# Patient Record
Sex: Female | Born: 1975 | Race: White | Hispanic: No | Smoking: Current every day smoker
Health system: Southern US, Community
[De-identification: ages and names within clinical notes are randomized; demographics above are authoritative.]

## PROBLEM LIST (undated history)

## (undated) DIAGNOSIS — M549 Dorsalgia, unspecified: Secondary | ICD-10-CM

## (undated) DIAGNOSIS — G8929 Other chronic pain: Secondary | ICD-10-CM

## (undated) DIAGNOSIS — F419 Anxiety disorder, unspecified: Secondary | ICD-10-CM

## (undated) DIAGNOSIS — F329 Major depressive disorder, single episode, unspecified: Secondary | ICD-10-CM

## (undated) DIAGNOSIS — F32A Depression, unspecified: Secondary | ICD-10-CM

## (undated) HISTORY — PX: BACK SURGERY: SHX140

## (undated) HISTORY — PX: BONY PELVIS SURGERY: SHX572

---

## 2004-09-09 ENCOUNTER — Emergency Department (HOSPITAL_COMMUNITY): Admission: EM | Admit: 2004-09-09 | Discharge: 2004-09-09 | Payer: Self-pay | Admitting: Emergency Medicine

## 2010-09-28 DIAGNOSIS — R072 Precordial pain: Secondary | ICD-10-CM

## 2010-10-31 ENCOUNTER — Emergency Department (HOSPITAL_COMMUNITY)
Admission: EM | Admit: 2010-10-31 | Discharge: 2010-10-31 | Disposition: A | Discharge status: Home / Self Care | Payer: Self-pay | Attending: Emergency Medicine | Admitting: Emergency Medicine

## 2010-10-31 ENCOUNTER — Encounter: Payer: Self-pay | Admitting: *Deleted

## 2010-10-31 DIAGNOSIS — M62838 Other muscle spasm: Secondary | ICD-10-CM | POA: Insufficient documentation

## 2010-10-31 DIAGNOSIS — IMO0001 Reserved for inherently not codable concepts without codable children: Secondary | ICD-10-CM | POA: Insufficient documentation

## 2010-10-31 DIAGNOSIS — F17200 Nicotine dependence, unspecified, uncomplicated: Secondary | ICD-10-CM | POA: Insufficient documentation

## 2010-10-31 DIAGNOSIS — F411 Generalized anxiety disorder: Secondary | ICD-10-CM | POA: Insufficient documentation

## 2010-10-31 DIAGNOSIS — G8929 Other chronic pain: Secondary | ICD-10-CM

## 2010-10-31 HISTORY — DX: Anxiety disorder, unspecified: F41.9

## 2010-10-31 HISTORY — DX: Major depressive disorder, single episode, unspecified: F32.9

## 2010-10-31 HISTORY — DX: Dorsalgia, unspecified: M54.9

## 2010-10-31 HISTORY — DX: Depression, unspecified: F32.A

## 2010-10-31 HISTORY — DX: Other chronic pain: G89.29

## 2010-10-31 MED ORDER — IBUPROFEN 800 MG PO TABS
800.0000 mg | ORAL_TABLET | Freq: Once | ORAL | Status: AC
  Administered 2010-10-31: 800 mg via ORAL
  Filled 2010-10-31: qty 1

## 2010-10-31 MED ORDER — DIAZEPAM 5 MG PO TABS: 5.0000 mg | ORAL_TABLET | Freq: Two times a day (BID) | ORAL | Status: AC

## 2010-10-31 MED ORDER — DIAZEPAM 5 MG PO TABS
5.0000 mg | ORAL_TABLET | Freq: Once | ORAL | Status: AC
  Administered 2010-10-31: 5 mg via ORAL
  Filled 2010-10-31: qty 1

## 2010-10-31 MED ORDER — IBUPROFEN 600 MG PO TABS: 600.0000 mg | ORAL_TABLET | Freq: Four times a day (QID) | ORAL | Status: AC | PRN

## 2010-10-31 NOTE — ED Notes (Signed)
C/o generalized chronic back pain "for a while."  Denies new injury.

## 2010-10-31 NOTE — ED Notes (Signed)
Pa in with pt.

## 2010-10-31 NOTE — ED Provider Notes (Signed)
Medical screening examination/treatment/procedure(s) were performed by non-physician practitioner and as supervising physician I was immediately available for consultation/collaboration. Devoria Albe, MD, Armando Gang  Ward Givens, MD 10/31/10 [phone]
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
## 2010-11-14 ENCOUNTER — Emergency Department (HOSPITAL_COMMUNITY)
Admission: EM | Admit: 2010-11-14 | Discharge: 2010-11-14 | Disposition: A | Discharge status: Home / Self Care | Payer: Self-pay | Attending: Emergency Medicine | Admitting: Emergency Medicine

## 2010-11-14 ENCOUNTER — Encounter (HOSPITAL_COMMUNITY): Payer: Self-pay | Admitting: *Deleted

## 2010-11-14 ENCOUNTER — Emergency Department (HOSPITAL_COMMUNITY): Payer: Self-pay

## 2010-11-14 DIAGNOSIS — F17200 Nicotine dependence, unspecified, uncomplicated: Secondary | ICD-10-CM | POA: Insufficient documentation

## 2010-11-14 DIAGNOSIS — R51 Headache: Secondary | ICD-10-CM | POA: Insufficient documentation

## 2010-11-14 DIAGNOSIS — M545 Low back pain, unspecified: Secondary | ICD-10-CM | POA: Insufficient documentation

## 2010-11-14 DIAGNOSIS — F329 Major depressive disorder, single episode, unspecified: Secondary | ICD-10-CM | POA: Insufficient documentation

## 2010-11-14 DIAGNOSIS — F3289 Other specified depressive episodes: Secondary | ICD-10-CM | POA: Insufficient documentation

## 2010-11-14 DIAGNOSIS — Z888 Allergy status to other drugs, medicaments and biological substances status: Secondary | ICD-10-CM | POA: Insufficient documentation

## 2010-11-14 DIAGNOSIS — N949 Unspecified condition associated with female genital organs and menstrual cycle: Secondary | ICD-10-CM | POA: Insufficient documentation

## 2010-11-14 DIAGNOSIS — M549 Dorsalgia, unspecified: Secondary | ICD-10-CM

## 2010-11-14 DIAGNOSIS — F411 Generalized anxiety disorder: Secondary | ICD-10-CM | POA: Insufficient documentation

## 2010-11-14 LAB — URINALYSIS, ROUTINE W REFLEX MICROSCOPIC
Hgb urine dipstick: NEGATIVE
Leukocytes, UA: NEGATIVE
Protein, ur: NEGATIVE mg/dL
Urobilinogen, UA: 0.2 mg/dL (ref 0.0–1.0)

## 2010-11-14 LAB — PREGNANCY, URINE: Preg Test, Ur: NEGATIVE

## 2010-11-14 LAB — COMPREHENSIVE METABOLIC PANEL
BUN: 13 mg/dL (ref 6–23)
CO2: 27 mEq/L (ref 19–32)
Calcium: 9.7 mg/dL (ref 8.4–10.5)
Creatinine, Ser: 0.59 mg/dL (ref 0.50–1.10)
GFR calc Af Amer: >60 mL/min (ref >60)
GFR calc non Af Amer: >60 mL/min (ref >60)
Glucose, Bld: 70 mg/dL (ref 70–99)

## 2010-11-14 LAB — CBC
HCT: 43.5 % (ref 36.0–46.0)
MCH: 31 pg (ref 26.0–34.0)
MCV: 91.8 fL (ref 78.0–100.0)
RBC: 4.74 MIL/uL (ref 3.87–5.11)
RDW: 12.8 % (ref 11.5–15.5)
WBC: 10.8 10*3/uL — ABNORMAL HIGH (ref 4.0–10.5)

## 2010-11-14 LAB — DIFFERENTIAL
Eosinophils Relative: 1 % (ref 0–5)
Lymphocytes Relative: 30 % (ref 12–46)
Lymphs Abs: 3.3 10*3/uL (ref 0.7–4.0)
Monocytes Absolute: 0.7 10*3/uL (ref 0.1–1.0)

## 2010-11-14 MED ORDER — LORAZEPAM 1 MG PO TABS
1.0000 mg | ORAL_TABLET | Freq: Once | ORAL | Status: AC
  Administered 2010-11-14: 1 mg via ORAL
  Filled 2010-11-14: qty 1

## 2010-11-14 MED ORDER — SODIUM CHLORIDE 0.9 % IV SOLN
Freq: Once | INTRAVENOUS | Status: AC
  Administered 2010-11-14: 125 mL via INTRAVENOUS

## 2010-11-14 MED ORDER — OXYCODONE-ACETAMINOPHEN 5-325 MG PO TABS: 1.0000 | ORAL_TABLET | Freq: Four times a day (QID) | ORAL | Status: AC | PRN

## 2010-11-14 MED ORDER — ONDANSETRON HCL 4 MG/2ML IJ SOLN
4.0000 mg | Freq: Once | INTRAMUSCULAR | Status: AC
  Administered 2010-11-14: 4 mg via INTRAVENOUS
  Filled 2010-11-14: qty 2

## 2010-11-14 MED ORDER — DIPHENHYDRAMINE HCL 50 MG/ML IJ SOLN
25.0000 mg | Freq: Once | INTRAMUSCULAR | Status: AC
  Administered 2010-11-14: 50 mg via INTRAVENOUS
  Filled 2010-11-14: qty 1

## 2010-11-14 MED ORDER — HYDROMORPHONE HCL 1 MG/ML IJ SOLN
1.0000 mg | Freq: Once | INTRAMUSCULAR | Status: AC
  Administered 2010-11-14: 1 mg via INTRAVENOUS
  Filled 2010-11-14: qty 1

## 2010-11-14 NOTE — ED Notes (Signed)
Pt c/o chronic pain in her back, right ankle, and headache for a couple days. States that she is having panic attacks and feels like she can't breathe. Pt states that she cries all the time.

## 2010-11-14 NOTE — ED Notes (Signed)
Pt to xray dept.

## 2010-11-14 NOTE — ED Notes (Signed)
Pt states lower chronic back pain and ankle pain from previous fall in 2011. NAD at this time.
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
## 2010-11-14 NOTE — ED Notes (Signed)
Pt requesting "something for anxiety" EMD aware. EMD going in to see pt.

## 2010-11-16 ENCOUNTER — Emergency Department (HOSPITAL_COMMUNITY)
Admission: EM | Admit: 2010-11-16 | Discharge: 2010-11-16 | Disposition: A | Discharge status: Home / Self Care | Payer: Self-pay | Attending: Emergency Medicine | Admitting: Emergency Medicine

## 2010-11-16 ENCOUNTER — Encounter (HOSPITAL_COMMUNITY): Payer: Self-pay | Admitting: Emergency Medicine

## 2010-11-16 DIAGNOSIS — M549 Dorsalgia, unspecified: Secondary | ICD-10-CM | POA: Insufficient documentation

## 2010-11-16 DIAGNOSIS — F329 Major depressive disorder, single episode, unspecified: Secondary | ICD-10-CM | POA: Insufficient documentation

## 2010-11-16 DIAGNOSIS — M5380 Other specified dorsopathies, site unspecified: Secondary | ICD-10-CM | POA: Insufficient documentation

## 2010-11-16 DIAGNOSIS — IMO0002 Reserved for concepts with insufficient information to code with codable children: Secondary | ICD-10-CM

## 2010-11-16 DIAGNOSIS — Z9181 History of falling: Secondary | ICD-10-CM | POA: Insufficient documentation

## 2010-11-16 DIAGNOSIS — G8921 Chronic pain due to trauma: Secondary | ICD-10-CM | POA: Insufficient documentation

## 2010-11-16 DIAGNOSIS — F3289 Other specified depressive episodes: Secondary | ICD-10-CM | POA: Insufficient documentation

## 2010-11-16 DIAGNOSIS — F17200 Nicotine dependence, unspecified, uncomplicated: Secondary | ICD-10-CM | POA: Insufficient documentation

## 2010-11-16 DIAGNOSIS — F411 Generalized anxiety disorder: Secondary | ICD-10-CM | POA: Insufficient documentation

## 2010-11-16 DIAGNOSIS — Z113 Encounter for screening for infections with a predominantly sexual mode of transmission: Secondary | ICD-10-CM | POA: Insufficient documentation

## 2010-11-16 LAB — URINALYSIS, ROUTINE W REFLEX MICROSCOPIC
Bilirubin Urine: NEGATIVE
Nitrite: NEGATIVE
Specific Gravity, Urine: 1.015 (ref 1.005–1.030)
pH: 6.5 (ref 5.0–8.0)

## 2010-11-16 LAB — URINE MICROSCOPIC-ADD ON

## 2010-11-16 LAB — RAPID HIV SCREEN (WH-MAU): Rapid HIV Screen: NONREACTIVE

## 2010-11-16 MED ORDER — HYDROCODONE-ACETAMINOPHEN 5-500 MG PO TABS: 1.0000 | ORAL_TABLET | Freq: Four times a day (QID) | ORAL | Status: AC | PRN

## 2010-11-16 MED ORDER — CYCLOBENZAPRINE HCL 10 MG PO TABS
ORAL_TABLET | ORAL | Status: AC
  Filled 2010-11-16: qty 1

## 2010-11-16 MED ORDER — HYDROCODONE-ACETAMINOPHEN 5-325 MG PO TABS
1.0000 | ORAL_TABLET | Freq: Once | ORAL | Status: AC
  Administered 2010-11-16: 1 via ORAL

## 2010-11-16 MED ORDER — HYDROCODONE-ACETAMINOPHEN 5-325 MG PO TABS
ORAL_TABLET | ORAL | Status: AC
  Filled 2010-11-16: qty 1

## 2010-11-16 MED ORDER — CYCLOBENZAPRINE HCL 10 MG PO TABS
10.0000 mg | ORAL_TABLET | Freq: Once | ORAL | Status: AC
Start: 2010-11-16 — End: 2010-11-16
  Administered 2010-11-16: 10 mg via ORAL

## 2010-11-16 MED ORDER — CYCLOBENZAPRINE HCL 5 MG PO TABS: 5.0000 mg | ORAL_TABLET | Freq: Three times a day (TID) | ORAL | Status: AC | PRN

## 2010-11-16 NOTE — ED Notes (Signed)
Called at 13:10 with no answer x 1

## 2010-11-16 NOTE — ED Notes (Signed)
Pt c/o chronic pain and anxiety. Pt seen 9/24 in ed for same. Pt given prescription for ativan and percocet. Pt states neither are helping her. Pt states she wants a prescription for valium. Nad noted.
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
## 2010-11-16 NOTE — ED Provider Notes (Signed)
History   Chart scribed for Ethelda Chick, MD by Enos Fling; the patient was seen in room APA04/APA04; this patient's care was started at 11:14 AM.    CSN: [id number] Arrival date & time: 11/16/2010 10:31 AM  Chief Complaint  Patient presents with  . Back Pain  . Anxiety    HPI Melody Carter is a 35 y.o. female who presents to the Emergency Department complaining of back pain. Pt c/o worsening back pain since seen in ED 2 days ago; she was prescribed percocet but states it is causing pruritis and only providing minimal relief of pain. Pt has tried benadryl with no relief of pruritis. Also given ativan which she states is not helping her anxiety, pt requesting valium prescription which she states helped her anxiety previously. Pt reports h/o chronic back pain since injury from fall last year, usually takes ibuprofen and aspirin for pain. Pt denies new injury or fall. No numbness, tingling, weakness, or incontinence. No other complaints.  No PCP  Past Medical History  Diagnosis Date  . Chronic back pain   . Anxiety   . Depression     Past Surgical History  Procedure Date  . Bony pelvis surgery     History reviewed. No pertinent family history.  History  Substance Use Topics  . Smoking status: Current Everyday Smoker    Types: Cigarettes  . Smokeless tobacco: Not on file  . Alcohol Use: No    OB History    Grav Para Term Preterm Abortions TAB SAB Ect Mult Living                  Review of Systems 10 Systems reviewed and are negative for acute change except as noted in the HPI.  Allergies  Geodon; Haldol; Risperidone and related; and Percocet  Home Medications   Current Outpatient Rx  Name Route Sig Dispense Refill  . ACETAMINOPHEN 500 MG PO TABS Oral Take 1,000 mg by mouth every 6 (six) hours as needed. Pain     . DIVALPROEX SODIUM 500 MG PO TBEC Oral Take 500 mg by mouth 2 (two) times daily.      . IBUPROFEN 200 MG PO TABS Oral Take 400 mg by mouth  every 6 (six) hours as needed. Pain    . OXYCODONE-ACETAMINOPHEN 5-325 MG PO TABS Oral Take 1 tablet by mouth every 6 (six) hours as needed for pain. 30 tablet 0  . DIVALPROEX SODIUM 500 MG PO TB24 Oral Take 500 mg by mouth 2 (two) times daily.     . DULOXETINE HCL 30 MG PO CPEP Oral Take 30 mg by mouth daily.      Marland Kitchen HYDROCODONE-ACETAMINOPHEN 5-500 MG PO TABS Oral Take 1-2 tablets by mouth every 6 (six) hours as needed for pain. 15 tablet 0    BP 138/96  Pulse 113  Temp 98.6 F (37 C)  Resp 20  Ht 5\' 3"  (1.6 m)  Wt 151 lb (68.493 kg)  BMI 26.75 kg/m2  SpO2 96%  LMP 10/09/2010  Physical Exam  Nursing note and vitals reviewed. Constitutional: She is oriented to person, place, and time. She appears well-developed and well-nourished. No distress.  HENT:  Head: Normocephalic.  Mouth/Throat: Mucous membranes are normal.  Eyes: Conjunctivae are normal.  Neck: Normal range of motion. Neck supple.  Cardiovascular: Normal rate, regular rhythm and intact distal pulses.  Exam reveals no gallop and no friction rub.   No murmur heard. Pulmonary/Chest: Effort normal and breath sounds normal.  She has no wheezes. She has no rales.  Abdominal: Soft. There is no tenderness.  Musculoskeletal: Normal range of motion. She exhibits no edema.       Spine nontender; palpable muscle spasms to back  Neurological: She is alert and oriented to person, place, and time.  Skin: Skin is warm and dry. No rash noted.  Psychiatric: She has a normal mood and affect.    ED Course  Procedures - none  OTHER DATA REVIEWED: Nursing notes and vital signs reviewed. Prior records reviewed.   LABS / RADIOLOGY:   All results reviewed and discussed, questions answered, pt agreeable with plan.     MDM  Prior ED visits reviewed.  xrays from 9/24 reviewed as well as urine.  No acute abnormalities there.  Pt requesing valium as she states ativan is not helping her anxiety.  Also requesting dilaudid for pain  control- states percocet makes her too itchy.  Offered benadryl, flexeril- pt states these will not help.  Pt agreeable with trying hydrococone as I stated I would not prescribe dilaudid for her chronic pain.  Long d/w patient about the need for a PMD or pain management to aid with her chronic pain.    IMPRESSION: 1. Chronic pain due to injury       MEDS GIVEN IN ED:  Medications  HYDROcodone-acetaminophen (VICODIN) 5-500 MG per tablet (not administered)     DISCHARGE MEDICATIONS: New Prescriptions   HYDROCODONE-ACETAMINOPHEN (VICODIN) 5-500 MG PER TABLET    Take 1-2 tablets by mouth every 6 (six) hours as needed for pain.     SCRIBE ATTESTATION: I personally performed the services described in this documentation, which was scribed in my presence. The recorded information has been reviewed and considered. Ethelda Chick, MD         Ethelda Chick, MD 11/16/10 [phone]

## 2010-11-16 NOTE — ED Notes (Signed)
Hx of chronic back pain with multiple surgeries; reports fall in December of 2011 and has had worsening pain since; was seen for same at this ED 2 days ago; states Percocet makes her itch, even with taking Benadryl, and that Tylenol and ibuprofen do not help; pt states, "I don't have any medicaid so I can't see a doctor".  Pt appears drowsy, in no distress; reports 10/10 lower back pain.

## 2010-11-17 LAB — GC/CHLAMYDIA PROBE AMP, GENITAL
Chlamydia, DNA Probe: NEGATIVE
GC Probe Amp, Genital: NEGATIVE

## 2010-11-17 LAB — HEPATITIS PANEL, ACUTE: Hep B C IgM: NEGATIVE

## 2011-01-13 ENCOUNTER — Emergency Department (HOSPITAL_COMMUNITY): Payer: Medicaid Other

## 2011-01-13 ENCOUNTER — Emergency Department (HOSPITAL_COMMUNITY)
Admission: EM | Admit: 2011-01-13 | Discharge: 2011-01-13 | Disposition: A | Discharge status: Home / Self Care | Payer: Medicaid Other | Attending: Emergency Medicine | Admitting: Emergency Medicine

## 2011-01-13 ENCOUNTER — Encounter (HOSPITAL_COMMUNITY): Payer: Self-pay | Admitting: *Deleted

## 2011-01-13 DIAGNOSIS — M25579 Pain in unspecified ankle and joints of unspecified foot: Secondary | ICD-10-CM | POA: Insufficient documentation

## 2011-01-13 DIAGNOSIS — F411 Generalized anxiety disorder: Secondary | ICD-10-CM | POA: Insufficient documentation

## 2011-01-13 DIAGNOSIS — F419 Anxiety disorder, unspecified: Secondary | ICD-10-CM

## 2011-01-13 DIAGNOSIS — M545 Low back pain, unspecified: Secondary | ICD-10-CM | POA: Insufficient documentation

## 2011-01-13 DIAGNOSIS — G8929 Other chronic pain: Secondary | ICD-10-CM

## 2011-01-13 DIAGNOSIS — X500XXA Overexertion from strenuous movement or load, initial encounter: Secondary | ICD-10-CM | POA: Insufficient documentation

## 2011-01-13 DIAGNOSIS — S93409A Sprain of unspecified ligament of unspecified ankle, initial encounter: Secondary | ICD-10-CM | POA: Insufficient documentation

## 2011-01-13 DIAGNOSIS — S96919A Strain of unspecified muscle and tendon at ankle and foot level, unspecified foot, initial encounter: Secondary | ICD-10-CM

## 2011-01-13 LAB — URINALYSIS, ROUTINE W REFLEX MICROSCOPIC
Glucose, UA: NEGATIVE mg/dL
Ketones, ur: NEGATIVE mg/dL
Leukocytes, UA: NEGATIVE
Specific Gravity, Urine: 1.025 (ref 1.005–1.030)
pH: 6 (ref 5.0–8.0)

## 2011-01-13 LAB — URINE MICROSCOPIC-ADD ON

## 2011-01-13 LAB — POCT PREGNANCY, URINE: Preg Test, Ur: NEGATIVE

## 2011-01-13 MED ORDER — DIAZEPAM 5 MG PO TABS: 5.0000 mg | ORAL_TABLET | Freq: Two times a day (BID) | ORAL | Status: AC | PRN

## 2011-01-13 MED ORDER — IBUPROFEN 800 MG PO TABS: 800.0000 mg | ORAL_TABLET | Freq: Three times a day (TID) | ORAL | Status: AC | PRN

## 2011-01-13 MED ORDER — HYDROCODONE-ACETAMINOPHEN 5-325 MG PO TABS: 1.0000 | ORAL_TABLET | ORAL | Status: AC | PRN

## 2011-01-13 MED ORDER — DIAZEPAM 5 MG PO TABS
5.0000 mg | ORAL_TABLET | Freq: Once | ORAL | Status: AC
  Administered 2011-01-13: 5 mg via ORAL
  Filled 2011-01-13: qty 1

## 2011-01-13 MED ORDER — IBUPROFEN 800 MG PO TABS
800.0000 mg | ORAL_TABLET | Freq: Once | ORAL | Status: AC
  Administered 2011-01-13: 800 mg via ORAL
  Filled 2011-01-13: qty 1

## 2011-01-13 NOTE — ED Provider Notes (Signed)
History     CSN: [id number] Arrival date & time: 01/13/2011  9:01 AM   First MD Initiated Contact with Patient 01/13/11 0900      Chief Complaint  Patient presents with  . Joint Pain    (Consider location/radiation/quality/duration/timing/severity/associated sxs/prior treatment) Patient is a 35 y.o. female presenting with back pain. The history is provided by the patient.  Back Pain  This is a chronic problem. The problem occurs constantly. The problem has been gradually worsening. Associated with: She fell off a bridge one year ago,  requiring back surgery and placment of rods.  She has constant pain which has worsened over the past 4 months.   The pain is present in the thoracic spine and lumbar spine. The pain does not radiate. The pain is at a severity of 10/10. The pain is severe. The symptoms are aggravated by bending and twisting. Associated symptoms include dysuria. Pertinent negatives include no chest pain, no fever, no numbness, no headaches, no abdominal pain, no bladder incontinence, no paresthesias, no paresis, no tingling and no weakness. Associated symptoms comments: She also reports tripping while on the bottom step,  Causing twisting injury to her right ankle.  She also describes increased urinary frequency and burning pain with urination only,  Along with low back cramping and pressure.  She has just started her menses,  Having low pelvic cramping as well.  Denies vaginal discharge,  Fevers,  Chills, nausea or vomiting. She has increased anxiety as well,  Is scheduled to establish care with a psychiatrist in [place] in 3 days.  Denies suicidal,  Homicidal ideation..    Past Medical History  Diagnosis Date  . Chronic back pain   . Anxiety   . Depression     Past Surgical History  Procedure Date  . Bony pelvis surgery     No family history on file.  History  Substance Use Topics  . Smoking status: Current Everyday Smoker    Types: Cigarettes  . Smokeless  tobacco: Not on file  . Alcohol Use: No    OB History    Grav Para Term Preterm Abortions TAB SAB Ect Mult Living                  Review of Systems  Constitutional: Negative for fever.  HENT: Negative for congestion, sore throat and neck pain.   Eyes: Negative.   Respiratory: Negative for chest tightness and shortness of breath.   Cardiovascular: Negative for chest pain.  Gastrointestinal: Negative for nausea and abdominal pain.  Genitourinary: Positive for dysuria. Negative for bladder incontinence.  Musculoskeletal: Positive for back pain and arthralgias. Negative for joint swelling.  Skin: Negative.  Negative for rash and wound.  Neurological: Negative for dizziness, tingling, weakness, light-headedness, numbness, headaches and paresthesias.  Hematological: Negative.   Psychiatric/Behavioral: Negative for agitation. The patient is nervous/anxious.     Allergies  Geodon; Haldol; Risperidone and related; and Percocet  Home Medications   Current Outpatient Rx  Name Route Sig Dispense Refill  . RISPERDAL PO Oral Take by mouth.      . COGNEX PO Oral Take by mouth.      Marland Kitchen DIVALPROEX SODIUM 500 MG PO TB24 Oral Take 500 mg by mouth 2 (two) times daily.     Marland Kitchen DIVALPROEX SODIUM 500 MG PO TBEC Oral Take 500 mg by mouth 2 (two) times daily.      . IBUPROFEN 200 MG PO TABS Oral Take 400 mg by mouth every 6 (six)  hours as needed. Pain      BP 137/95  Pulse 87  Temp(Src) 98 F (36.7 C) (Oral)  Resp 20  Ht 5\' 3"  (1.6 m)  Wt 148 lb (67.132 kg)  BMI 26.22 kg/m2  SpO2 100%  LMP 01/13/2011  Physical Exam  Nursing note and vitals reviewed. Constitutional: She is oriented to person, place, and time. She appears well-developed and well-nourished.  HENT:  Head: Normocephalic and atraumatic.  Eyes: Conjunctivae are normal.  Neck: Normal range of motion.  Cardiovascular: Normal rate, regular rhythm, normal heart sounds and intact distal pulses.   Pulmonary/Chest: Effort normal and  breath sounds normal. She has no wheezes.  Abdominal: Soft. Bowel sounds are normal. There is no tenderness.  Musculoskeletal: Normal range of motion.       Right ankle: She exhibits normal range of motion, no swelling and no ecchymosis. tenderness. Lateral malleolus tenderness found. Achilles tendon normal.       Generalized back ttp,  Including entire spine,  Plus paraspinal soft tissues.  No visible trauma,  Ecchymosis,  Edema,  Well healed midline incision lumbar and thoracic spine.  Patient freely sits up in bed and bends forward to point to area of ankle pain and trauma with no apparent distress.  Neurological: She is alert and oriented to person, place, and time.  Skin: Skin is warm and dry.  Psychiatric: Her speech is normal and behavior is normal. Thought content normal. Her mood appears anxious. Her affect is not blunt and not inappropriate.    ED Course  Procedures (including critical care time)   Labs Reviewed  URINALYSIS, ROUTINE W REFLEX MICROSCOPIC  PREGNANCY, URINE   No results found.   No diagnosis found.    MDM  Ankle xray negative for acute injury,  Ace applied.  Instructed in RICE tx.  Chronic low back pain with no neuro deficits on today exam or by history.  Anxiety without suicidal/homical ideation - is scheduled to establish care for mental health issues in 3 days.  Valium for anxiety and/or chronic low back pain and muscle spasm.    The patient appears reasonably screened and/or stabilized for discharge and I doubt any other medical condition or other [facility] requiring further screening, evaluation, or treatment in the ED at this time prior to discharge.      Candis Musa, PA 01/13/11 1006

## 2011-01-13 NOTE — ED Notes (Signed)
Pt reports she injured her back in 2011, reports increased pain to lower back and rt ankle pain worsening over the past 2 days

## 2011-01-13 NOTE — ED Provider Notes (Signed)
Medical screening examination/treatment/procedure(s) were performed by non-physician practitioner and as supervising physician I was immediately available for consultation/collaboration. Devoria Albe, MD, Armando Gang   Ward Givens, MD 01/13/11 [phone]

## 2011-02-01 NOTE — ED Provider Notes (Signed)
Patient called back today upset complaining to charge nurse. States Dr. Gerilyn Pilgrim will not see her because she does not live in [place] anymore. She states she wants a referral to Dr. Eduard Clos- Freida Busman. Discharge instructions reprinted  adding Dr. Eduard Clos [place]'s phone number.  Devoria Albe, MD, Armando Gang   Ward Givens, MD 02/01/11 [phone]

## 2012-02-11 ENCOUNTER — Emergency Department (HOSPITAL_COMMUNITY)
Admission: EM | Admit: 2012-02-11 | Discharge: 2012-02-11 | Discharge status: Against Medical Advice | Payer: Self-pay | Attending: Emergency Medicine | Admitting: Emergency Medicine

## 2012-02-11 ENCOUNTER — Encounter (HOSPITAL_COMMUNITY): Payer: Self-pay | Admitting: Emergency Medicine

## 2012-02-11 DIAGNOSIS — F3289 Other specified depressive episodes: Secondary | ICD-10-CM | POA: Insufficient documentation

## 2012-02-11 DIAGNOSIS — G8929 Other chronic pain: Secondary | ICD-10-CM | POA: Insufficient documentation

## 2012-02-11 DIAGNOSIS — M549 Dorsalgia, unspecified: Secondary | ICD-10-CM | POA: Insufficient documentation

## 2012-02-11 DIAGNOSIS — K0889 Other specified disorders of teeth and supporting structures: Secondary | ICD-10-CM

## 2012-02-11 DIAGNOSIS — F411 Generalized anxiety disorder: Secondary | ICD-10-CM | POA: Insufficient documentation

## 2012-02-11 DIAGNOSIS — F329 Major depressive disorder, single episode, unspecified: Secondary | ICD-10-CM | POA: Insufficient documentation

## 2012-02-11 DIAGNOSIS — Z79899 Other long term (current) drug therapy: Secondary | ICD-10-CM | POA: Insufficient documentation

## 2012-02-11 MED ORDER — HYDROCODONE-ACETAMINOPHEN 5-325 MG PO TABS
1.0000 | ORAL_TABLET | Freq: Once | ORAL | Status: AC
  Administered 2012-02-11: 1 via ORAL
  Filled 2012-02-11: qty 1

## 2012-02-11 NOTE — ED Notes (Signed)
Had 8 teeth pulled x 3 days ago. States did not get rx antibiotic or pain med. No obvious swelling noted. C/o all over back pain, chronic back pain but worse this past week. r ankle pain, hx of crush injury. Nad.
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
## 2012-02-11 NOTE — ED Notes (Signed)
Pt not in room and gown on bed
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
## 2012-02-12 NOTE — ED Provider Notes (Signed)
Medical screening examination/treatment/procedure(s) were performed by non-physician practitioner and as supervising physician I was immediately available for consultation/collaboration.   Carleene Cooper III, MD 02/12/12 1336

## 2013-11-02 ENCOUNTER — Encounter (HOSPITAL_COMMUNITY): Payer: Self-pay | Admitting: Emergency Medicine

## 2013-11-02 ENCOUNTER — Emergency Department (HOSPITAL_COMMUNITY): Payer: Medicaid - Out of State

## 2013-11-02 ENCOUNTER — Emergency Department (HOSPITAL_COMMUNITY)
Admission: EM | Admit: 2013-11-02 | Discharge: 2013-11-02 | Disposition: A | Discharge status: Home / Self Care | Payer: Medicaid - Out of State | Attending: Emergency Medicine | Admitting: Emergency Medicine

## 2013-11-02 DIAGNOSIS — F17200 Nicotine dependence, unspecified, uncomplicated: Secondary | ICD-10-CM | POA: Insufficient documentation

## 2013-11-02 DIAGNOSIS — F411 Generalized anxiety disorder: Secondary | ICD-10-CM | POA: Insufficient documentation

## 2013-11-02 DIAGNOSIS — Y92009 Unspecified place in unspecified non-institutional (private) residence as the place of occurrence of the external cause: Secondary | ICD-10-CM | POA: Diagnosis not present

## 2013-11-02 DIAGNOSIS — Z8781 Personal history of (healed) traumatic fracture: Secondary | ICD-10-CM | POA: Insufficient documentation

## 2013-11-02 DIAGNOSIS — R296 Repeated falls: Secondary | ICD-10-CM | POA: Insufficient documentation

## 2013-11-02 DIAGNOSIS — M546 Pain in thoracic spine: Secondary | ICD-10-CM

## 2013-11-02 DIAGNOSIS — F3289 Other specified depressive episodes: Secondary | ICD-10-CM | POA: Diagnosis not present

## 2013-11-02 DIAGNOSIS — S99919A Unspecified injury of unspecified ankle, initial encounter: Secondary | ICD-10-CM

## 2013-11-02 DIAGNOSIS — S99929A Unspecified injury of unspecified foot, initial encounter: Secondary | ICD-10-CM | POA: Diagnosis present

## 2013-11-02 DIAGNOSIS — S8990XA Unspecified injury of unspecified lower leg, initial encounter: Secondary | ICD-10-CM | POA: Diagnosis present

## 2013-11-02 DIAGNOSIS — Z792 Long term (current) use of antibiotics: Secondary | ICD-10-CM | POA: Insufficient documentation

## 2013-11-02 DIAGNOSIS — IMO0002 Reserved for concepts with insufficient information to code with codable children: Secondary | ICD-10-CM | POA: Insufficient documentation

## 2013-11-02 DIAGNOSIS — S93409A Sprain of unspecified ligament of unspecified ankle, initial encounter: Secondary | ICD-10-CM | POA: Diagnosis not present

## 2013-11-02 DIAGNOSIS — F329 Major depressive disorder, single episode, unspecified: Secondary | ICD-10-CM | POA: Insufficient documentation

## 2013-11-02 DIAGNOSIS — S0993XA Unspecified injury of face, initial encounter: Secondary | ICD-10-CM | POA: Insufficient documentation

## 2013-11-02 DIAGNOSIS — S93401A Sprain of unspecified ligament of right ankle, initial encounter: Secondary | ICD-10-CM

## 2013-11-02 DIAGNOSIS — S199XXA Unspecified injury of neck, initial encounter: Secondary | ICD-10-CM

## 2013-11-02 DIAGNOSIS — Y9389 Activity, other specified: Secondary | ICD-10-CM | POA: Diagnosis not present

## 2013-11-02 DIAGNOSIS — G8929 Other chronic pain: Secondary | ICD-10-CM | POA: Diagnosis not present

## 2013-11-02 DIAGNOSIS — Z79899 Other long term (current) drug therapy: Secondary | ICD-10-CM | POA: Diagnosis not present

## 2013-11-02 MED ORDER — CYCLOBENZAPRINE HCL 10 MG PO TABS
10.0000 mg | ORAL_TABLET | Freq: Once | ORAL | Status: AC
  Administered 2013-11-02: 10 mg via ORAL
  Filled 2013-11-02: qty 1

## 2013-11-02 MED ORDER — HYDROCODONE-ACETAMINOPHEN 5-325 MG PO TABS
1.0000 | ORAL_TABLET | Freq: Once | ORAL | Status: AC
  Administered 2013-11-02: 1 via ORAL
  Filled 2013-11-02: qty 1

## 2013-11-02 MED ORDER — HYDROCODONE-ACETAMINOPHEN 7.5-325 MG PO TABS: 1.0000 | ORAL_TABLET | Freq: Four times a day (QID) | ORAL | Status: DC | PRN

## 2013-11-02 MED ORDER — KETOROLAC TROMETHAMINE 60 MG/2ML IM SOLN
60.0000 mg | Freq: Once | INTRAMUSCULAR | Status: AC
  Administered 2013-11-02: 60 mg via INTRAMUSCULAR
  Filled 2013-11-02: qty 2

## 2013-11-02 MED ORDER — CYCLOBENZAPRINE HCL 10 MG PO TABS: 10.0000 mg | ORAL_TABLET | Freq: Three times a day (TID) | ORAL | Status: DC | PRN

## 2013-11-02 NOTE — ED Notes (Signed)
Tammy PA in prior to RN, see PA assessment for further,  

## 2013-11-02 NOTE — Discharge Instructions (Signed)
Ankle Sprain An ankle sprain is an injury to the strong, fibrous tissues (ligaments) that hold your ankle bones together.  HOME CARE   Put ice on your ankle for 1-2 days or as told by your doctor.  Put ice in a plastic bag.  Place a towel between your skin and the bag.  Leave the ice on for 15-20 minutes at a time, every 2 hours while you are awake.  Only take medicine as told by your doctor.  Raise (elevate) your injured ankle above the level of your heart as much as possible for 2-3 days.  Use crutches if your doctor tells you to. Slowly put your own weight on the affected ankle. Use the crutches until you can walk without pain.  If you have a plaster splint:  Do not rest it on anything harder than a pillow for 24 hours.  Do not put weight on it.  Do not get it wet.  Take it off to shower or bathe.  If given, use an elastic wrap or support stocking for support. Take the wrap off if your toes lose feeling (numb), tingle, or turn cold or blue.  If you have an air splint:  Add or let out air to make it comfortable.  Take it off at night and to shower and bathe.  Wiggle your toes and move your ankle up and down often while you are wearing it. GET HELP IF:  You have rapidly increasing bruising or puffiness (swelling).  Your toes feel very cold.  You lose feeling in your foot.  Your medicine does not help your pain. GET HELP RIGHT AWAY IF:   Your toes lose feeling (numb) or turn blue.  You have severe pain that is increasing. MAKE SURE YOU:   Understand these instructions.  Will watch your condition.  Will get help right away if you are not doing well or get worse. Document Released: 07/26/2007 Document Revised: 06/23/2013 Document Reviewed: 08/21/2011 [employer] Patient Information 2015 [place], [place]. This information is not intended to replace advice given to you by your health care provider. Make sure you discuss any questions you have with your health care  provider.  Back Pain, Adult Back pain is very common. The pain often gets better over time. The cause of back pain is usually not dangerous. Most people can learn to manage their back pain on their own.  HOME CARE   Stay active. Start with short walks on flat ground if you can. Try to walk farther each day.  Do not sit, drive, or stand in one place for more than 30 minutes. Do not stay in bed.  Do not avoid exercise or work. Activity can help your back heal faster.  Be careful when you bend or lift an object. Bend at your knees, keep the object close to you, and do not twist.  Sleep on a firm mattress. Lie on your side, and bend your knees. If you lie on your back, put a pillow under your knees.  Only take medicines as told by your doctor.  Put ice on the injured area.  Put ice in a plastic bag.  Place a towel between your skin and the bag.  Leave the ice on for 15-20 minutes, 03-04 times a day for the first 2 to 3 days. After that, you can switch between ice and heat packs.  Ask your doctor about back exercises or massage.  Avoid feeling anxious or stressed. Find good ways to deal with  stress, such as exercise. GET HELP RIGHT AWAY IF:   Your pain does not go away with rest or medicine.  Your pain does not go away in 1 week.  You have new problems.  You do not feel well.  The pain spreads into your legs.  You cannot control when you poop (bowel movement) or pee (urinate).  Your arms or legs feel weak or lose feeling (numbness).  You feel sick to your stomach (nauseous) or throw up (vomit).  You have belly (abdominal) pain.  You feel like you may pass out (faint). MAKE SURE YOU:   Understand these instructions.  Will watch your condition.  Will get help right away if you are not doing well or get worse. Document Released: 07/26/2007 Document Revised: 05/01/2011 Document Reviewed: 06/10/2013 [facility] Patient Information 2015 [place], [place]. This information is  not intended to replace advice given to you by your health care provider. Make sure you discuss any questions you have with your health care provider.  [facility] Primary Care Doctor List    Kari Baars MD. Specialty: Pulmonary Disease Contact information: [address]  [address]  [place] [address]  [phone]   Syliva Overman, MD. Specialty: [facility] Medicine Contact information: [address]  [place] [address]  [phone]   Lilyan Punt, MD. Specialty: [facility] Medicine Contact information: [address]  [place] [address]  [phone]   Avon Gully, MD Specialty: Internal Medicine Contact information: [address] [place] [address]  [phone]   Catalina Pizza, MD. Specialty: Internal Medicine Contact information: [address]  [place] [address]  [phone]   Butch Penny, MD. Specialty: Family Medicine Contact information: [address]  [place] [address]  [phone]   John Giovanni, MD. Specialty: [facility] Medicine Contact information: [address]  [address]  [place] [address]  [phone]   Carylon Perches, MD. Specialty: Internal Medicine Contact information: [address]  [address]  [place] [address]  [phone]

## 2013-11-02 NOTE — ED Notes (Signed)
Pt upset with not being given anymore pain medication, explained to pt that I would not be able to give her anything else without orders, Tammy PA in prior to Rn speaking with pt, pt states " i don't understand how I could be hurting and yall don't do anything" Tammy PA notified, additional orders given

## 2013-11-02 NOTE — ED Notes (Signed)
Lights dimmed for pt comfort, update given on plan of care,
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
## 2013-11-02 NOTE — ED Provider Notes (Signed)
CSN: [id number]     Arrival date & time 11/02/13  1616 History  This chart was scribed for non-physician practitioner, Pauline Aus, PA-C,working with Benny Lennert, MD, by Karle Plumber, ED Scribe. This patient was seen in room APFT23/APFT23 and the patient's care was started at 4:30 PM.  Chief Complaint  Patient presents with  . Fall   Patient is a 38 y.o. female presenting with fall. The history is provided by the patient. No language interpreter was used.  Fall Pertinent negatives include no chest pain, no abdominal pain and no shortness of breath.   HPI Comments:  Melody Carter is a 38 y.o. female with PMH of chronic back pain, depression and anxiety who presents to the Emergency Department complaining of a fall that occurred yesterday. She states she was working in her yard and her right foot got stuck between two rocks causing her to fall back, landing on her mid to lower back. She reports continuing moderate mid back aching. She states she is also experiencing some neck pain as well. Deep breathing and movement makes the pain worse. Pt also reports right ankle pain. She reports fracturing the ankle in the past. She denies new numbness or tingling of the right foot.  She denies headaches, dizziness, LOC, incontinence of bowel or bladder, vomiting, numbness or weakness.     Past Medical History  Diagnosis Date  . Chronic back pain   . Anxiety   . Depression    Past Surgical History  Procedure Laterality Date  . Bony pelvis surgery    . Back surgery     History reviewed. No pertinent family history. History  Substance Use Topics  . Smoking status: Current Every Day Smoker    Types: Cigarettes  . Smokeless tobacco: Not on file  . Alcohol Use: No   OB History   Grav Para Term Preterm Abortions TAB SAB Ect Mult Living                 Review of Systems  Constitutional: Negative for fever, chills and fatigue.  HENT: Negative for sore throat and trouble swallowing.    Respiratory: Negative for cough, shortness of breath and wheezing.   Cardiovascular: Negative for chest pain and palpitations.  Gastrointestinal: Negative for nausea, vomiting, abdominal pain and blood in stool.  Genitourinary: Negative for dysuria, hematuria and flank pain.  Musculoskeletal: Positive for arthralgias and back pain. Negative for myalgias, neck pain and neck stiffness.  Skin: Negative for rash.  Neurological: Negative for dizziness, weakness and numbness.  Hematological: Does not bruise/bleed easily.    Allergies  Geodon; Haldol; and Risperidone and related  Home Medications   Prior to Admission medications   Medication Sig Start Date End Date Taking? Authorizing Provider  baclofen (LIORESAL) 10 MG tablet Take 10 mg by mouth 3 (three) times daily as needed. Back pain    Historical Provider, MD  doxycycline (VIBRA-TABS) 100 MG tablet Take 100 mg by mouth 2 (two) times daily. Started 02/06/2012 for 30 days    Historical Provider, MD  ibuprofen (ADVIL,MOTRIN) 600 MG tablet Take 600 mg by mouth every 6 (six) hours as needed. pain    Historical Provider, MD  sertraline (ZOLOFT) 50 MG tablet Take 50 mg by mouth daily.    Historical Provider, MD   Triage Vitals: BP 136/91  Pulse 88  Temp(Src) 98.2 F (36.8 C) (Oral)  Resp 16  Ht  (1.6 m)  Wt 157 lb (71.215 kg)  BMI 27.82  kg/m2  SpO2 98%  LMP 10/21/2013 Physical Exam  Nursing note and vitals reviewed. Constitutional: She is oriented to person, place, and time. She appears well-developed and well-nourished. No distress.  HENT:  Head: Normocephalic and atraumatic.  Eyes: Conjunctivae are normal. Pupils are equal, round, and reactive to light.  Neck: Normal range of motion. Neck supple.  Cardiovascular: Normal rate, regular rhythm, normal heart sounds and intact distal pulses.  Exam reveals no gallop and no friction rub.   No murmur heard. Pulmonary/Chest: Effort normal and breath sounds normal. No respiratory  distress. She has no wheezes. She has no rales. She exhibits no tenderness.  Abdominal: Soft. She exhibits no distension. There is no tenderness. There is no rebound and no guarding.  Musculoskeletal: She exhibits tenderness. She exhibits no edema.       Lumbar back: She exhibits tenderness and pain. She exhibits normal range of motion, no swelling, no deformity, no laceration and normal pulse.  Diffuse cervical and thoracic midline and paraspinal tenderness.   DP pulses are brisk and symmetrical.  Distal sensation intact.  Hip Flexors/Extensors are intact.  Pt has 5/5 strength against resistance of bilateral lower extremities.  Minimal tenderness of the right ankle w/o edema, wound or bony deformity.  ROM is intact.  compartments of the right LE are soft   Neurological: She is alert and oriented to person, place, and time. She has normal strength. No sensory deficit. She exhibits normal muscle tone. Coordination and gait normal.  Reflex Scores:      Patellar reflexes are 2+ on the right side and 2+ on the left side.      Achilles reflexes are 2+ on the right side and 2+ on the left side. Skin: Skin is warm and dry. No rash noted.  Well-healed surgical scar from mid thoracic spine down to lumbar spine.  No edema or surrounding erythema    ED Course  Procedures (including critical care time) DIAGNOSTIC STUDIES: Oxygen Saturation is 98% on RA, normal by my interpretation.   COORDINATION OF CARE: 4:37 PM- Will X-Ray C-spine and T-Spine and right ankle. Pt verbalizes understanding and agrees to plan.  Medications  cyclobenzaprine (FLEXERIL) tablet 10 mg (10 mg Oral Given 11/02/13 1651)  HYDROcodone-acetaminophen (NORCO/VICODIN) 5-325 MG per tablet 1 tablet (1 tablet Oral Given 11/02/13 1651)  ketorolac (TORADOL) injection 60 mg (60 mg Intramuscular Given 11/02/13 1803)    Labs Review Labs Reviewed - No data to display  Imaging Review Dg Cervical Spine Complete  11/02/2013   CLINICAL DATA:   Neck pain secondary to a fall today.  EXAM: CERVICAL SPINE  4+ VIEWS  COMPARISON:  None.  FINDINGS: There is no evidence of cervical spine fracture or prevertebral soft tissue swelling. Alignment is normal. No other significant bone abnormalities are identified.  IMPRESSION: Normal exam.   Electronically Signed   By: Geanie Cooley M.D.   On: 11/02/2013 18:30   Dg Thoracic Spine 2 View  11/02/2013   CLINICAL DATA:  Fall while pulling weeds.  Mid back pain.  EXAM: THORACIC SPINE - 2 VIEW  COMPARISON:  09/27/2010  FINDINGS: There is a mild scoliosis deformity involving the thoracic and lumbar spine. The patient is status post mid level fusion of the lower thoracic and upper lumbar spine. No acute fractures identified. The thoracic vertebral bodies appear well preserved.  IMPRESSION: 1. No acute findings. 2. Previous thoracic and lumbar fusion.   Electronically Signed   By: Signa Kell M.D.   On: 11/02/2013  18:29   Dg Ankle Complete Right  11/02/2013   CLINICAL DATA:  Lateral ankle pain secondary to a fall.  EXAM: RIGHT ANKLE - COMPLETE 3+ VIEW  COMPARISON:  01/13/2011  FINDINGS: There is no acute fracture or dislocation or joint effusion. There is an old deformity of the calcaneus and there are calcifications in the distal Achilles tendon at the insertion on the calcaneus.  IMPRESSION: No acute abnormalities.   Electronically Signed   By: Geanie Cooley M.D.   On: 11/02/2013 18:29     EKG Interpretation None      MDM   Final diagnoses:  Midline thoracic back pain  Sprain of right ankle, initial encounter   [place] Controlled substance database checked prior to exam and nothing was noted.  Patient ambulates with a steady gait.  No concerning sx's for emergent neurological or infectious process.  It was noted that upon discharge, another patient that was seen here earlier went into the patient's room stating that she was ride home. Rx for flexeril and #12 vicodin with understanding that patient will  arrange PMD recheck in 3-4 days.  Pt agrees to plan.  Appears stable for d/c   I personally performed the services described in this documentation, which was scribed in my presence. The recorded information has been reviewed and is accurate.    Anita Mcadory L. Trisha Mangle, PA-C 11/04/13 1454

## 2013-11-02 NOTE — ED Notes (Signed)
Tammy PA at bedside,  

## 2013-11-02 NOTE — ED Notes (Signed)
Pt requests Lyme's disease test as well

## 2013-11-02 NOTE — ED Notes (Signed)
Pt fell yesterday in yard, foot got caught in some weeds per pt, c/o back pain and right ankle pain, noted pt with limp to triage

## 2013-11-04 NOTE — ED Provider Notes (Signed)
Medical screening examination/treatment/procedure(s) were performed by non-physician practitioner and as supervising physician I was immediately available for consultation/collaboration.   EKG Interpretation None        Carlton Buskey L Taylynn Easton, MD 11/04/13 1512 

## 2015-06-26 ENCOUNTER — Encounter (HOSPITAL_COMMUNITY): Payer: Self-pay | Admitting: Emergency Medicine

## 2015-06-26 ENCOUNTER — Emergency Department (HOSPITAL_COMMUNITY)
Admission: EM | Admit: 2015-06-26 | Discharge: 2015-06-26 | Disposition: A | Discharge status: Home / Self Care | Payer: Medicaid - Out of State | Attending: Emergency Medicine | Admitting: Emergency Medicine

## 2015-06-26 DIAGNOSIS — N390 Urinary tract infection, site not specified: Secondary | ICD-10-CM | POA: Insufficient documentation

## 2015-06-26 DIAGNOSIS — L0390 Cellulitis, unspecified: Secondary | ICD-10-CM

## 2015-06-26 DIAGNOSIS — L0291 Cutaneous abscess, unspecified: Secondary | ICD-10-CM

## 2015-06-26 DIAGNOSIS — N898 Other specified noninflammatory disorders of vagina: Secondary | ICD-10-CM | POA: Diagnosis not present

## 2015-06-26 DIAGNOSIS — F17210 Nicotine dependence, cigarettes, uncomplicated: Secondary | ICD-10-CM | POA: Diagnosis not present

## 2015-06-26 DIAGNOSIS — R21 Rash and other nonspecific skin eruption: Secondary | ICD-10-CM | POA: Diagnosis present

## 2015-06-26 DIAGNOSIS — L03114 Cellulitis of left upper limb: Secondary | ICD-10-CM | POA: Insufficient documentation

## 2015-06-26 LAB — WET PREP, GENITAL
Clue Cells Wet Prep HPF POC: NONE SEEN
Sperm: NONE SEEN
TRICH WET PREP: NONE SEEN
YEAST WET PREP: NONE SEEN

## 2015-06-26 LAB — URINE MICROSCOPIC-ADD ON

## 2015-06-26 LAB — URINALYSIS, ROUTINE W REFLEX MICROSCOPIC
Bilirubin Urine: NEGATIVE
GLUCOSE, UA: NEGATIVE mg/dL
Hgb urine dipstick: NEGATIVE
Ketones, ur: NEGATIVE mg/dL
Nitrite: NEGATIVE
PH: 6 (ref 5.0–8.0)
PROTEIN: NEGATIVE mg/dL
SPECIFIC GRAVITY, URINE: 1.015 (ref 1.005–1.030)

## 2015-06-26 LAB — POC URINE PREG, ED: Preg Test, Ur: NEGATIVE

## 2015-06-26 MED ORDER — LIDOCAINE HCL (PF) 1 % IJ SOLN
10.0000 mL | Freq: Once | INTRAMUSCULAR | Status: AC
  Administered 2015-06-26: 10 mL via INTRADERMAL
  Filled 2015-06-26: qty 10

## 2015-06-26 MED ORDER — AZITHROMYCIN 1 G PO PACK
1.0000 g | PACK | Freq: Once | ORAL | Status: AC
  Administered 2015-06-26: 1 g via ORAL
  Filled 2015-06-26: qty 1

## 2015-06-26 MED ORDER — ACETAMINOPHEN 325 MG PO TABS: 650.0000 mg | ORAL_TABLET | Freq: Four times a day (QID) | ORAL | Status: DC | PRN

## 2015-06-26 MED ORDER — CEPHALEXIN 500 MG PO CAPS
500.0000 mg | ORAL_CAPSULE | Freq: Once | ORAL | Status: AC
  Administered 2015-06-26: 500 mg via ORAL
  Filled 2015-06-26: qty 1

## 2015-06-26 MED ORDER — CEPHALEXIN 500 MG PO CAPS: 500.0000 mg | ORAL_CAPSULE | Freq: Four times a day (QID) | ORAL | Status: AC

## 2015-06-26 NOTE — ED Notes (Signed)
EDP at bedside  

## 2015-06-26 NOTE — ED Notes (Signed)
Pt has multiple complaints including scabbed areas covering body, abscess to left forearm, vaginal discharge, and lower abdominal pain.

## 2015-06-26 NOTE — ED Notes (Signed)
I&D tray and lidocaine placed at bedside.

## 2015-06-26 NOTE — ED Notes (Signed)
Phlebotomy at beside 

## 2015-06-26 NOTE — ED Provider Notes (Signed)
CSN: [id number]     Arrival date & time 06/26/15  1529 History   First MD Initiated Contact with Patient 06/26/15 1540     Chief Complaint  Patient presents with  . Illness     (Consider location/radiation/quality/duration/timing/severity/associated sxs/prior Treatment) Patient is a 40 y.o. female presenting with rash, vaginal discharge, and abdominal pain.  Rash Location:  Full body Quality: itchiness, redness and swelling   Severity:  Mild Onset quality:  Gradual Timing:  Constant Progression:  Worsening Chronicity:  New Associated symptoms: abdominal pain   Associated symptoms: no fatigue, no fever and no nausea   Vaginal Discharge Associated symptoms: abdominal pain   Associated symptoms: no fever and no nausea   Abdominal Pain Associated symptoms: vaginal discharge   Associated symptoms: no fatigue, no fever and no nausea     Past Medical History  Diagnosis Date  . Chronic back pain   . Anxiety   . Depression    Past Surgical History  Procedure Laterality Date  . Bony pelvis surgery    . Back surgery     History reviewed. No pertinent family history. Social History  Substance Use Topics  . Smoking status: Current Every Day Smoker -- 0.50 packs/day    Types: Cigarettes  . Smokeless tobacco: None  . Alcohol Use: No   OB History    No data available     Review of Systems  Constitutional: Negative for fever and fatigue.  Eyes: Negative for pain.  Gastrointestinal: Positive for abdominal pain. Negative for nausea.  Endocrine: Negative for polydipsia and polyuria.  Genitourinary: Positive for vaginal discharge.  Skin: Positive for rash.  All other systems reviewed and are negative.     Allergies  Geodon; Haldol; and Risperidone and related  Home Medications   Prior to Admission medications   Medication Sig Start Date End Date Taking? Authorizing Provider  acetaminophen (TYLENOL) 325 MG tablet Take 2 tablets (650 mg total) by mouth every 6 (six)  hours as needed for moderate pain. 06/26/15   Marily Memos, MD  cephALEXin (KEFLEX) 500 MG capsule Take 1 capsule (500 mg total) by mouth 4 (four) times daily. 06/26/15 07/03/15  Barbara Cower Gilmer Kaminsky, MD   BP 135/68 mmHg  Pulse 84  Temp(Src) 99 F (37.2 C) (Temporal)  Resp 16  Ht  (1.6 m)  Wt 127 lb (57.607 kg)  BMI 22.50 kg/m2  SpO2 99% Physical Exam  Constitutional: She is oriented to person, place, and time. She appears well-developed and well-nourished.  HENT:  Head: Normocephalic and atraumatic.  Eyes: Conjunctivae are normal. Pupils are equal, round, and reactive to light.  Neck: Normal range of motion.  Cardiovascular: Normal rate and regular rhythm.   Pulmonary/Chest: Effort normal. No stridor. No respiratory distress. She has no wheezes. She has no rales.  Abdominal: She exhibits no distension.  Musculoskeletal: Normal range of motion. She exhibits no edema or tenderness.  Neurological: She is alert and oriented to person, place, and time.  Skin: Skin is warm and dry. Rash (full body, sparing hands and soles, excoriated scabbed lesions in different stages of healing. ) noted.  Area of erythema to left lateral forearm with indurated area in the middle. No fluctuance.   Nursing note and vitals reviewed.   ED Course  .Marland KitchenIncision and Drainage Date/Time: 06/26/2015 5:02 PM Performed by: Marily Memos Authorized by: Marily Memos Consent: Verbal consent obtained. Risks and benefits: risks, benefits and alternatives were discussed Consent given by: patient Time out: Immediately prior to procedure  a "time out" was called to verify the correct patient, procedure, equipment, support staff and site/side marked as required. Type: abscess Body area: upper extremity Location details: left elbow Local anesthetic: lidocaine 1% without epinephrine Anesthetic total: 2 ml Scalpel size: 11 Incision type: elliptical Complexity: simple Drainage: purulent Drainage amount: scant Wound treatment:  wound left open Patient tolerance: Patient tolerated the procedure well with no immediate complications   (including critical care time) Labs Review Labs Reviewed  WET PREP, GENITAL - Abnormal; Notable for the following:    WBC, Wet Prep HPF POC FEW (*)    All other components within normal limits  URINALYSIS, ROUTINE W REFLEX MICROSCOPIC (NOT [facility]) - Abnormal; Notable for the following:    Color, Urine AMBER (*)    Leukocytes, UA TRACE (*)    All other components within normal limits  URINE MICROSCOPIC-ADD ON - Abnormal; Notable for the following:    Squamous Epithelial / LPF 0-5 (*)    Bacteria, UA RARE (*)    All other components within normal limits  RPR  HIV ANTIBODY (ROUTINE TESTING)  POC URINE PREG, ED  GC/CHLAMYDIA PROBE AMP ([place]) NOT AT [facility]    Imaging Review No results found. I have personally reviewed and evaluated these images and lab results as part of my medical decision-making.   EKG Interpretation None      MDM   Final diagnoses:  Vaginal discharge  Cellulitis and abscess  Rash  UTI (lower urinary tract infection)    40 yo F w/ multiple problems. Abscess I&D as above. Cellulitis around it as well, started on keflex.  Lower abdominal pain, vaginal discharge, recent unprotected sexual encounter, no trich/BV or yeast on wt prep. tx prophylactically with azithromycin but on keflex for other problems so will work for prophylaxis as well. 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   New Prescriptions: Discharge Medication List as of 06/26/2015  4:55 PM    START taking these medications   Details  acetaminophen (TYLENOL) 325 MG tablet Take 2 tablets (650 mg total) by mouth every 6 (six) hours as needed for moderate pain., Starting 06/26/2015, Until Discontinued, Print    cephALEXin (KEFLEX) 500 MG capsule Take 1 capsule (500 mg total) by mouth 4 (four) times daily., Starting 06/26/2015, Until Sat 07/03/15, Print        I  have personally and contemperaneously reviewed labs and imaging and used in my decision making as above.   A medical screening exam was performed and I feel the patient has had an appropriate workup for their chief complaint at this time and likelihood of emergent condition existing is low and thus workup can continue on an outpatient basis.. Their vital signs are stable. They have been counseled on decision, discharge, follow up and which symptoms necessitate immediate return to the emergency department.  They verbally stated understanding and agreement with plan and discharged in stable condition.    Marily MemosJason Roise Emert, MD 06/26/15 [id number]

## 2015-06-26 NOTE — ED Notes (Signed)
EDP at bedside updating patient. 

## 2015-06-27 LAB — HIV ANTIBODY (ROUTINE TESTING W REFLEX): HIV Screen 4th Generation wRfx: NONREACTIVE

## 2015-06-27 LAB — RPR: RPR: NONREACTIVE

## 2015-06-28 LAB — GC/CHLAMYDIA PROBE AMP (~~LOC~~) NOT AT ARMC
Chlamydia: NEGATIVE
NEISSERIA GONORRHEA: NEGATIVE

## 2015-06-29 ENCOUNTER — Telehealth (HOSPITAL_BASED_OUTPATIENT_CLINIC_OR_DEPARTMENT_OTHER): Payer: Self-pay | Admitting: Emergency Medicine

## 2016-04-28 IMAGING — CR DG ANKLE COMPLETE 3+V*R*
3 series · 3 of 3 positions shown · non-contrast
Comparison: 01/13/2011

CLINICAL DATA: Lateral ankle pain secondary to a fall.

EXAM:
RIGHT ANKLE - COMPLETE 3+ VIEW

[view not recorded (1 of 3)]
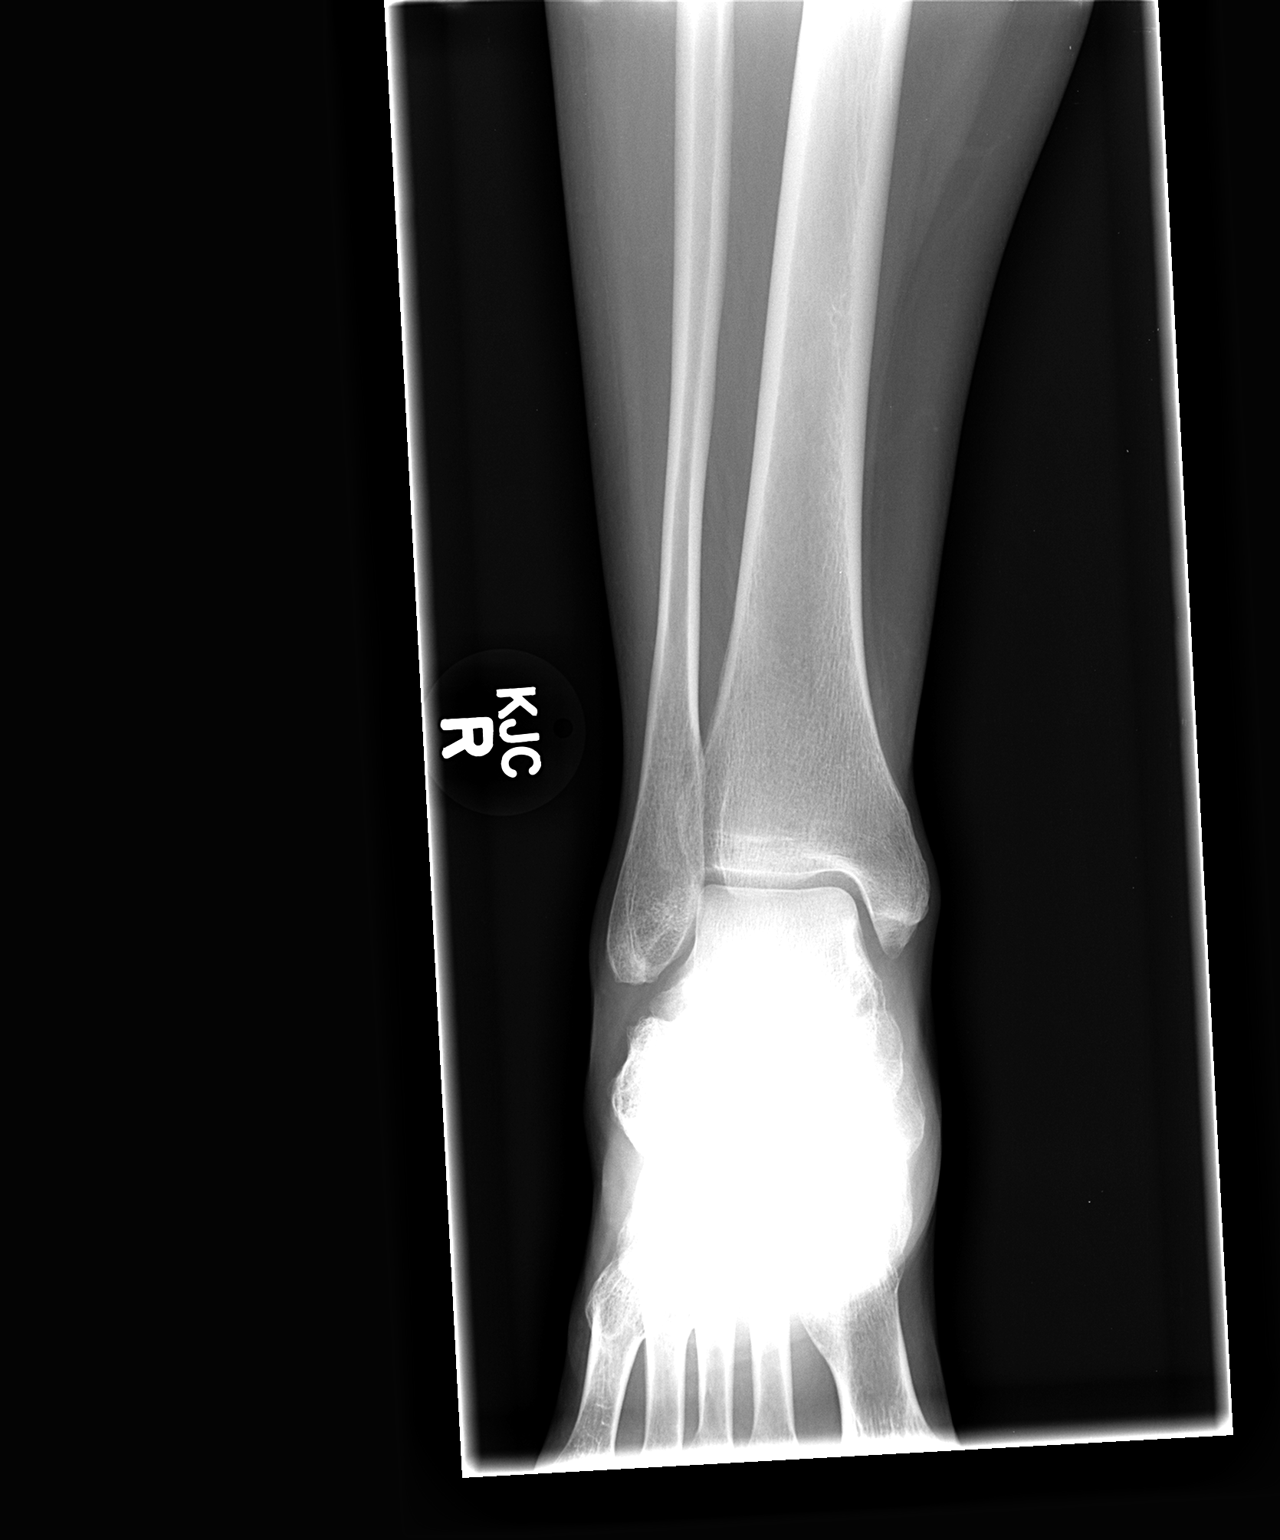

[view not recorded (2 of 3)]
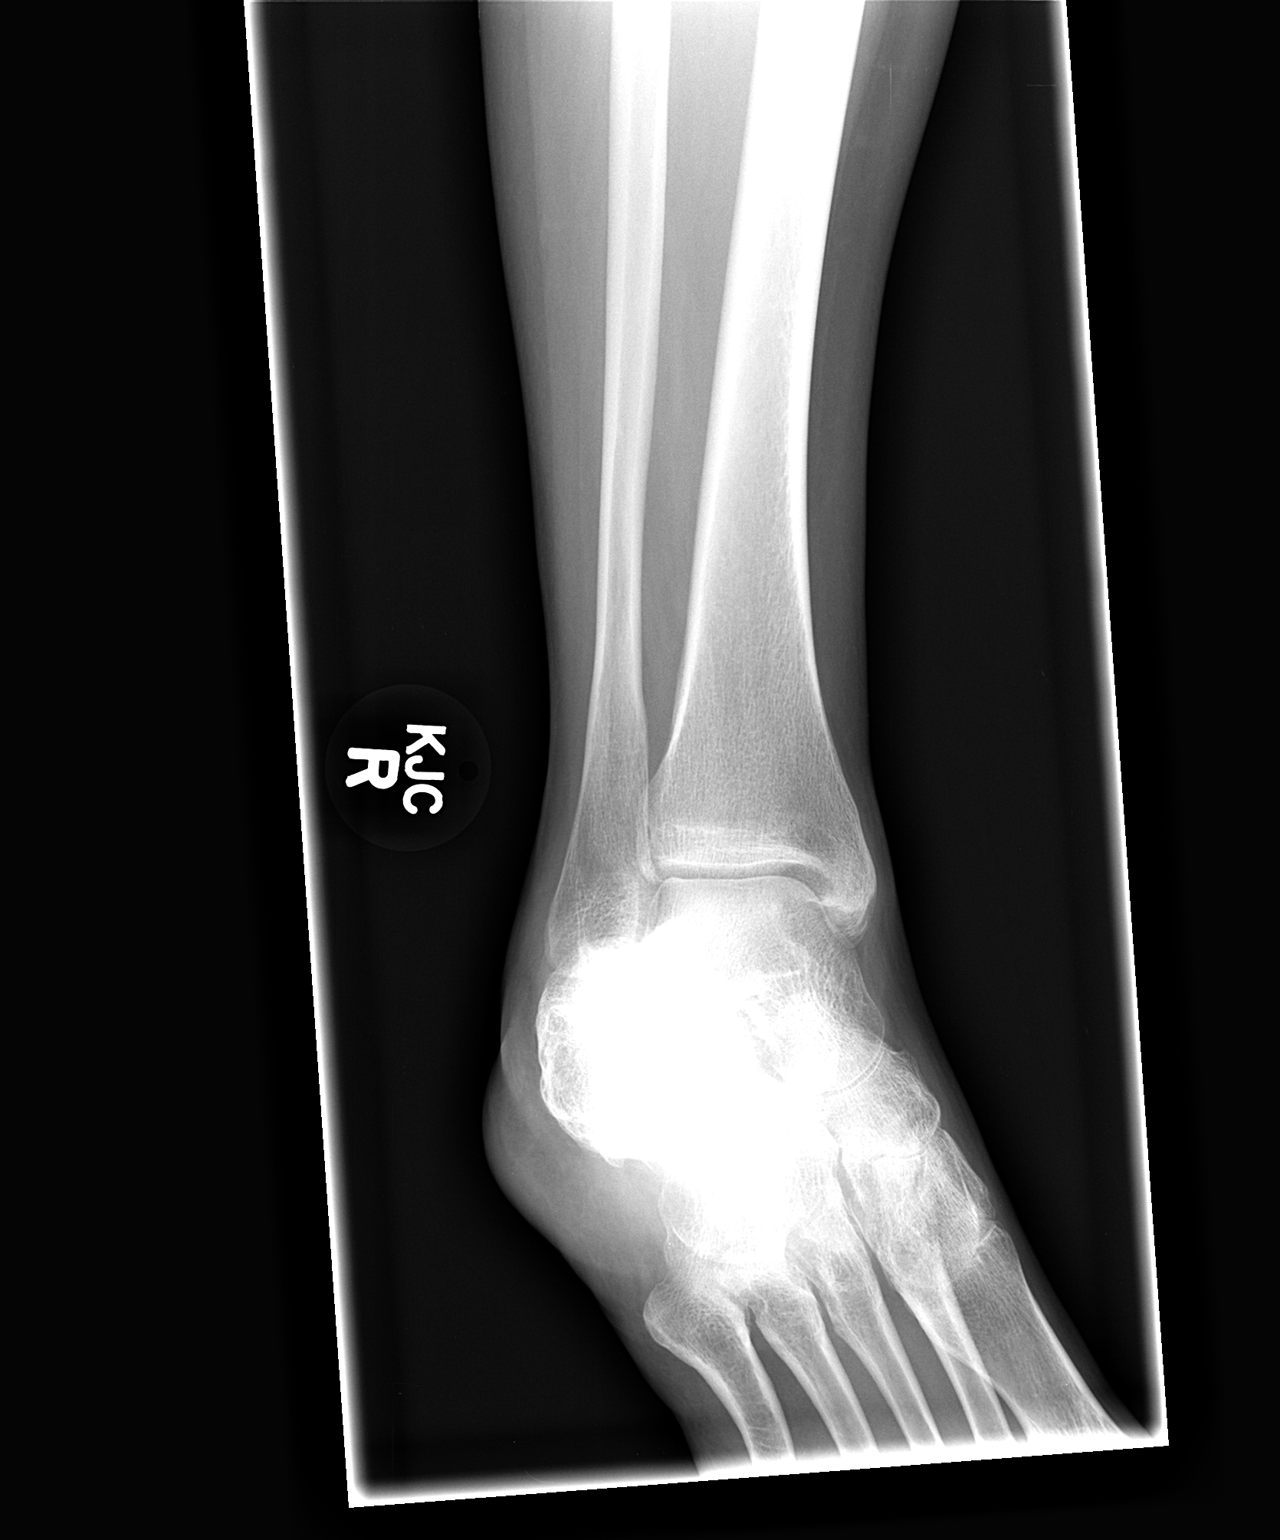

[view not recorded (3 of 3)]
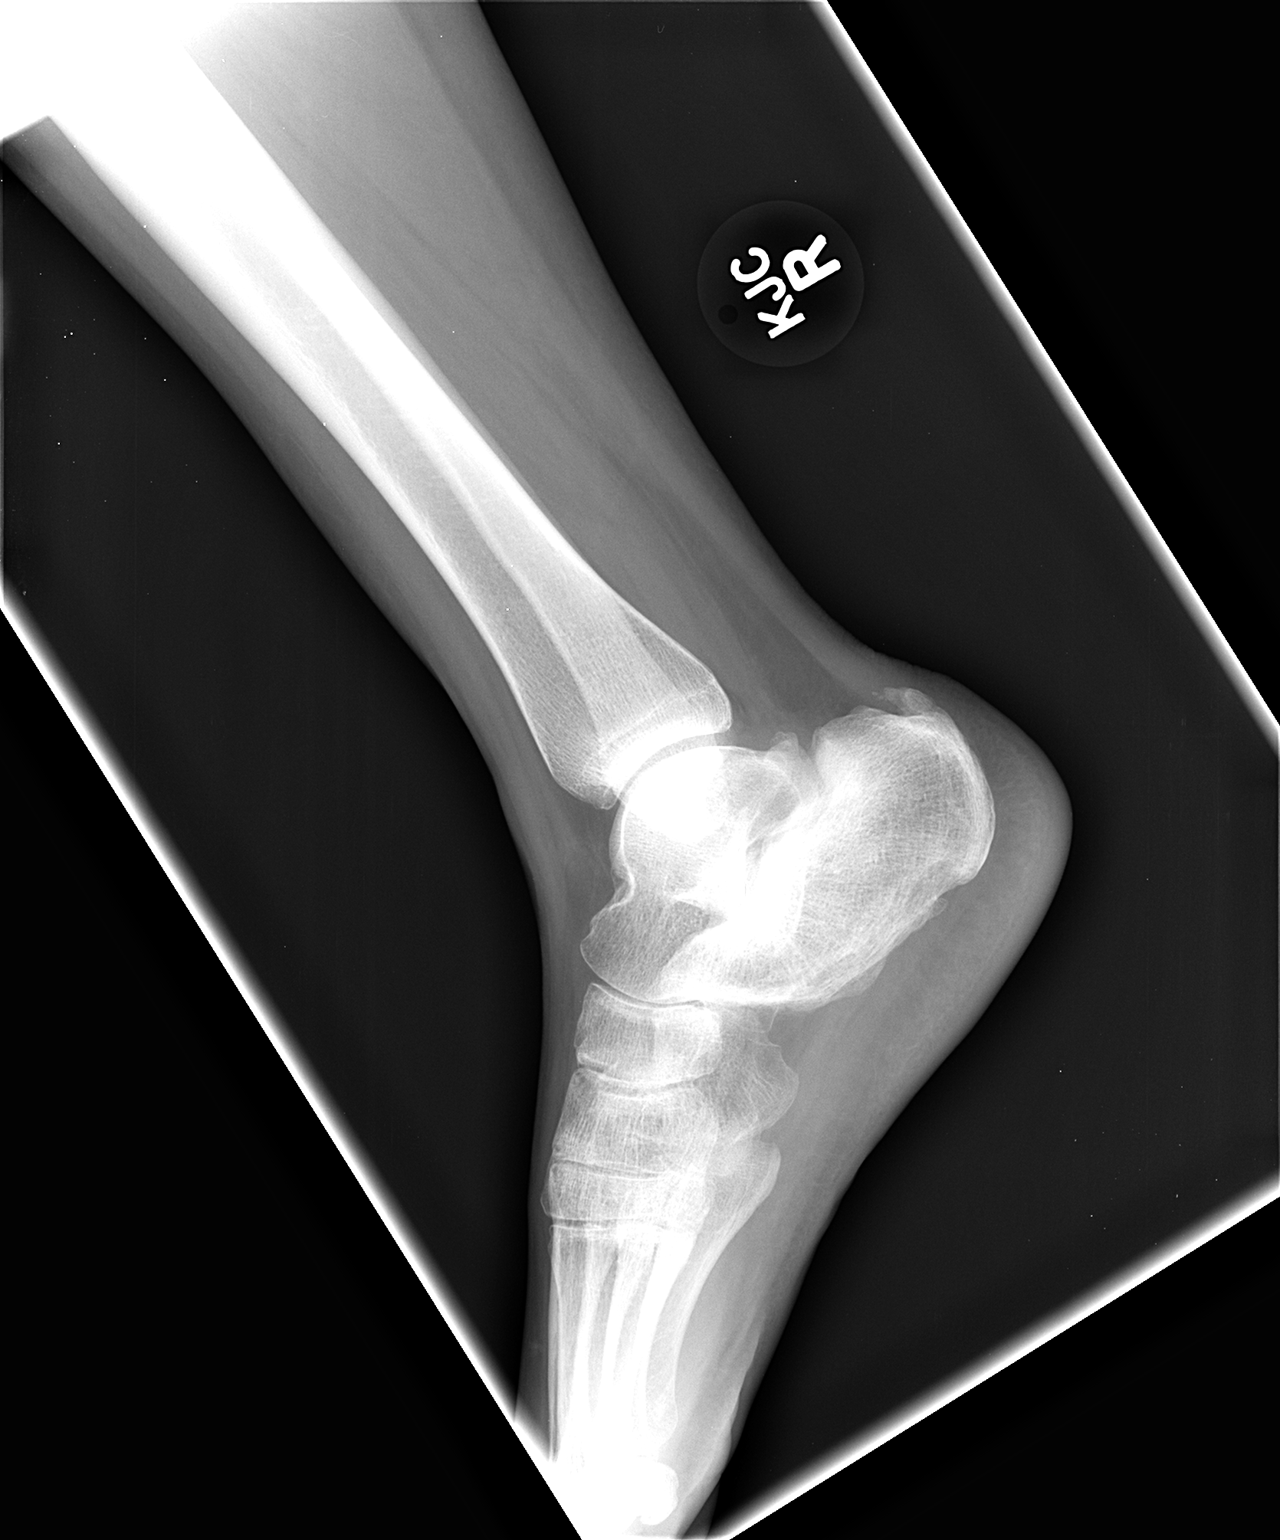

[3 of 3 positions shown; findings below may reference images not displayed]

FINDINGS: There is no acute fracture or dislocation or joint effusion. There
is an old deformity of the calcaneus and there are calcifications in
the distal Achilles tendon at the insertion on the calcaneus.
IMPRESSION: No acute abnormalities.

## 2017-11-23 ENCOUNTER — Emergency Department (HOSPITAL_COMMUNITY)
Admission: EM | Admit: 2017-11-23 | Discharge: 2017-11-23 | Disposition: A | Discharge status: Home / Self Care | Payer: Medicaid - Out of State | Attending: Emergency Medicine | Admitting: Emergency Medicine

## 2017-11-23 ENCOUNTER — Encounter (HOSPITAL_COMMUNITY): Payer: Self-pay | Admitting: Emergency Medicine

## 2017-11-23 ENCOUNTER — Other Ambulatory Visit: Payer: Self-pay

## 2017-11-23 DIAGNOSIS — M791 Myalgia, unspecified site: Secondary | ICD-10-CM | POA: Diagnosis not present

## 2017-11-23 DIAGNOSIS — F17210 Nicotine dependence, cigarettes, uncomplicated: Secondary | ICD-10-CM | POA: Insufficient documentation

## 2017-11-23 DIAGNOSIS — R51 Headache: Secondary | ICD-10-CM | POA: Diagnosis present

## 2017-11-23 DIAGNOSIS — N390 Urinary tract infection, site not specified: Secondary | ICD-10-CM | POA: Insufficient documentation

## 2017-11-23 DIAGNOSIS — R519 Headache, unspecified: Secondary | ICD-10-CM

## 2017-11-23 LAB — URINALYSIS, ROUTINE W REFLEX MICROSCOPIC
BACTERIA UA: NONE SEEN
Bilirubin Urine: NEGATIVE
Glucose, UA: NEGATIVE mg/dL
Hgb urine dipstick: NEGATIVE
KETONES UR: NEGATIVE mg/dL
Nitrite: NEGATIVE
PROTEIN: NEGATIVE mg/dL
Specific Gravity, Urine: 1.02 (ref 1.005–1.030)
pH: 8 (ref 5.0–8.0)

## 2017-11-23 LAB — GROUP A STREP BY PCR: GROUP A STREP BY PCR: NOT DETECTED

## 2017-11-23 MED ORDER — DIAZEPAM 5 MG PO TABS
5.0000 mg | ORAL_TABLET | Freq: Once | ORAL | Status: AC
  Administered 2017-11-23: 5 mg via ORAL
  Filled 2017-11-23: qty 1

## 2017-11-23 MED ORDER — PROCHLORPERAZINE EDISYLATE 10 MG/2ML IJ SOLN
5.0000 mg | Freq: Once | INTRAMUSCULAR | Status: AC
  Administered 2017-11-23: 5 mg via INTRAVENOUS
  Filled 2017-11-23: qty 2

## 2017-11-23 MED ORDER — CEPHALEXIN 500 MG PO CAPS: 500.0000 mg | ORAL_CAPSULE | Freq: Four times a day (QID) | ORAL | 0 refills | Status: AC

## 2017-11-23 MED ORDER — KETOROLAC TROMETHAMINE 30 MG/ML IJ SOLN
30.0000 mg | Freq: Once | INTRAMUSCULAR | Status: AC
  Administered 2017-11-23: 30 mg via INTRAVENOUS
  Filled 2017-11-23: qty 1

## 2017-11-23 MED ORDER — SODIUM CHLORIDE 0.9 % IV SOLN: 1000.0000 mL | INTRAVENOUS | Status: DC

## 2017-11-23 MED ORDER — SODIUM CHLORIDE 0.9 % IV BOLUS (SEPSIS)
1000.0000 mL | Freq: Once | INTRAVENOUS | Status: AC
  Administered 2017-11-23: 1000 mL via INTRAVENOUS

## 2017-11-23 MED ORDER — DIPHENHYDRAMINE HCL 50 MG/ML IJ SOLN: 25.0000 mg | Freq: Once | INTRAMUSCULAR | Status: DC

## 2017-11-23 NOTE — Discharge Instructions (Signed)
Your urine test suggest a urinary tract infection.  Please use Keflex with breakfast, lunch, dinner, and at bedtime.  You have chosen to leave the hospital before the remainder of your work-up could be completed.  Please see your primary physician for additional evaluation.  Return to the emergency department if any changes in your condition, problems, or concerns.

## 2017-11-23 NOTE — ED Triage Notes (Addendum)
Pt c/o of a headache almost 2 weeks with dysuria, odor and lower back pain x 4 days. Pt states n/v x 3 days.

## 2017-11-23 NOTE — ED Provider Notes (Signed)
[facility] EMERGENCY DEPARTMENT Provider Note   CSN: [id number] Arrival date & time: 11/23/17  4098     History   Chief Complaint Chief Complaint  Patient presents with  . Headache    HPI Melody Carter is a 42 y.o. female.  Patient is a 42 year old female who presents to the emergency department with 2 weeks of increasing head headache.  There is problems with dysuria and back pain as well.  Patient states she has had nausea and vomiting accompanying the symptoms.  There is been no recent injury to the head or to the back.  The patient has not measured her temperature, and is unsure of temperature elevation.  She has some photophobia.  She has some pain with urination.  It is of note that the patient suffers from chronic back pain, but the patient states this pain feels different.  She presents now for assistance with these multiple issues.  The history is provided by the patient.    Past Medical History:  Diagnosis Date  . Anxiety   . Chronic back pain   . Depression     There are no active problems to display for this patient.   Past Surgical History:  Procedure Laterality Date  . BACK SURGERY    . BONY PELVIS SURGERY       OB History   None      Home Medications    Prior to Admission medications   Medication Sig Start Date End Date Taking? Authorizing Provider  acetaminophen (TYLENOL) 325 MG tablet Take 2 tablets (650 mg total) by mouth every 6 (six) hours as needed for moderate pain. 06/26/15   Mesner, Barbara Cower, MD    Family History No family history on file.  Social History Social History   Tobacco Use  . Smoking status: Current Every Day Smoker    Packs/day: 0.50    Types: Cigarettes  . Smokeless tobacco: Never Used  Substance Use Topics  . Alcohol use: No  . Drug use: No     Allergies   Geodon [ziprasidone hydrochloride]; Haldol [haloperidol decanoate]; and Risperidone and related   Review of Systems Review of Systems  Constitutional:  Positive for activity change, appetite change and fatigue. Negative for chills.       All ROS Neg except as noted in HPI  HENT: Negative for congestion, nosebleeds, sore throat and trouble swallowing.   Eyes: Positive for photophobia. Negative for discharge.  Respiratory: Negative for cough, shortness of breath and wheezing.   Cardiovascular: Negative for chest pain and palpitations.  Gastrointestinal: Positive for nausea and vomiting. Negative for abdominal pain and blood in stool.  Genitourinary: Positive for dysuria. Negative for frequency and hematuria.  Musculoskeletal: Positive for back pain. Negative for arthralgias and neck pain.  Skin: Negative.   Neurological: Positive for headaches. Negative for dizziness, seizures and speech difficulty.  Psychiatric/Behavioral: Negative for confusion and hallucinations.     Physical Exam Updated Vital Signs BP 111/79 (BP Location: Right Arm)   Pulse 74   Temp 98.3 F (36.8 C) (Oral)   Resp 18   Ht 5\' 3"  (1.6 m)   Wt 61.7 kg   SpO2 99%   BMI 24.09 kg/m   Physical Exam  Constitutional: She is oriented to person, place, and time. She appears well-developed and well-nourished.  Non-toxic appearance.  HENT:  Head: Normocephalic.  Right Ear: Tympanic membrane and external ear normal.  Left Ear: Tympanic membrane and external ear normal.  Eyes: Pupils are equal,  round, and reactive to light. EOM and lids are normal.  Neck: Normal range of motion. Neck supple. Carotid bruit is not present.  Cardiovascular: Normal rate, regular rhythm, normal heart sounds, intact distal pulses and normal pulses.  Pulmonary/Chest: Breath sounds normal. No respiratory distress.  Abdominal: Soft. Bowel sounds are normal. There is no splenomegaly or hepatomegaly. There is tenderness in the right lower quadrant and left lower quadrant. There is CVA tenderness. There is no rigidity and no guarding.  Musculoskeletal: Normal range of motion.       Lumbar back: She  exhibits pain. She exhibits no deformity.  Lymphadenopathy:       Head (right side): No submandibular adenopathy present.       Head (left side): No submandibular adenopathy present.    She has no cervical adenopathy.  Neurological: She is alert and oriented to person, place, and time. She has normal strength. No cranial nerve deficit or sensory deficit.  Skin: Skin is warm and dry.  Psychiatric: She has a normal mood and affect. Her speech is normal.  Nursing note and vitals reviewed.    ED Treatments / Results  Labs (all labs ordered are listed, but only abnormal results are displayed) Labs Reviewed  URINALYSIS, ROUTINE W REFLEX MICROSCOPIC    EKG None  Radiology No results found.  Procedures Procedures (including critical care time)  Medications Ordered in ED Medications - No data to display   Initial Impression / Assessment and Plan / ED Course  I have reviewed the triage vital signs and the nursing notes.  Pertinent labs & imaging results that were available during my care of the patient were reviewed by me and considered in my medical decision making (see chart for details).       Final Clinical Impressions(s) / ED Diagnoses MDM  Vital signs within normal limits.  Pulse oximetry is 99% on room air.  Within normal limits by my interpretation. Urinalysis shows a cloudy specimen with a specific gravity of 1.020.  There is a small leukocyte esterase present with 6-10 white cells present.  Culture has been sent to the lab.  Patient reported to nursing staff that she has drawing of her legs when she takes Benadryl.  Benadryl left out of the headache cocktail.  11:05 AM.  Patient reports the nursing staff that she is having drawing of her legs after receiving medications.  We will check a urine drug screen, complete blood count, basic metabolic panel, and order oral Valium for possible spasm pain.  Vital signs remain in normal range.  The patient is ambulatory.  No  neurovascular deficits appreciated on examination.  The patient states that she is no longer having the drawing and restlessness in her legs.  I explained to the patient that the work-up had not been completed, but the patient states that she wants to sign out AGAINST MEDICAL ADVICE.  I discussed with her the possible dangers of not completing her work-up.  The patient states that she accepts this responsibility but she wants to check out AGAINST MEDICAL ADVICE.   Final diagnoses:  Bad headache  Myalgia  Urinary tract infection without hematuria, site unspecified    ED Discharge Orders         Ordered    cephALEXin (KEFLEX) 500 MG capsule  4 times daily     11/23/17 1208           Ivery Quale, PA-C 11/23/17 1209    Bethann Berkshire, MD 11/23/17 [phone]

## 2017-11-23 NOTE — ED Notes (Signed)
Pt leaving AMA after speaking with Ivery Quale, PA.  Pt is aware that she is responsible once she leave the hospital.

## 2017-11-23 NOTE — ED Notes (Signed)
Refusing 2nd bag of IVF,  Loney Laurence, PA informed.

## 2017-11-25 LAB — URINE CULTURE: Special Requests: NORMAL
